# Patient Record
Sex: Female | Born: 1995 | Race: Black or African American | Hispanic: No | Marital: Single | Smoking: Never smoker
Health system: Southern US, Community
[De-identification: ages and names within clinical notes are randomized; demographics above are authoritative.]

## PROBLEM LIST (undated history)

## (undated) DIAGNOSIS — G43909 Migraine, unspecified, not intractable, without status migrainosus: Secondary | ICD-10-CM

## (undated) DIAGNOSIS — N390 Urinary tract infection, site not specified: Secondary | ICD-10-CM

## (undated) DIAGNOSIS — R040 Epistaxis: Secondary | ICD-10-CM

## (undated) HISTORY — PX: WISDOM TOOTH EXTRACTION: SHX21

---
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
## 2016-08-20 ENCOUNTER — Encounter: Payer: Self-pay | Admitting: Family Medicine

## 2016-08-20 ENCOUNTER — Ambulatory Visit (INDEPENDENT_AMBULATORY_CARE_PROVIDER_SITE_OTHER): Payer: Medicaid Other | Admitting: Family Medicine

## 2016-08-20 VITALS — BP 100/62 | HR 77 | Temp 98.2°F | Ht 67.0 in | Wt 164.0 lb

## 2016-08-20 DIAGNOSIS — Z0000 Encounter for general adult medical examination without abnormal findings: Secondary | ICD-10-CM

## 2016-08-20 NOTE — Patient Instructions (Signed)
It was great seeing you today! We have addressed the following issues today  1. I will see on 8/31 for HIV,GC/CH and any any other needed blood work needed from a health maintenance standpoint.  If we did any lab work today, and the results require attention, either me or my nurse will get in touch with you. If everything is normal, you will get a letter in mail and a message via . If you don't hear from us in two weeks, please give us a call. Otherwise, we look forward to seeing you again at your next visit. If you have any questions or concerns before then, please call the clinic at [phone].  Please bring all your medications to every doctors visit  Sign up for My Chart to have easy access to your labs results, and communication with your Primary care physician. Please ask Front Desk for some assistance.   Please check-out at the front desk before leaving the clinic.    Take Care,   Dr. Sydnee Cabaliallo

## 2016-08-20 NOTE — Progress Notes (Signed)
   Subjective:    Patient ID: Stann Orerykah J Briggs-White, female    DOB: Feb 12, 1995, 21 y.o.   MRN: [id number]   CC: Establishing care w/ new PCP  HPI: Patient is a 21 yo female who presents today to establish care with new PCP. Patient has no acute complaints today. She reports a sinus infection 2 weeks ago for which she received amoxicillin. Patient has been feeling better since then. 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 Patient denies any chest pain, SOB, abdominal pain, headaches or vision changes.  Smoking status reviewed   ROS: all other systems were reviewed and are negative other than in the HPI   Past Medical History:  Diagnosis Date  . Frequent nosebleeds 12/2013  . Migraines   . UTI (urinary tract infection)    started antibiotic 12/25/2013    Past Surgical History:  Procedure Laterality Date  . NASAL ENDOSCOPY WITH EPISTAXIS CONTROL N/A 01/01/2014   Procedure: ENDOSCOPIC NASAL CAUTERY;  Surgeon: Osborn Cohoavid Shoemaker, MD;  Location: [facility];  Service: ENT;  Laterality: N/A;  . WISDOM TOOTH EXTRACTION      Objective:  BP 100/62   Pulse 77   Temp 98.2 F (36.8 C) (Oral)   Ht 5\' 7"  (1.702 m)   Wt 164 lb (74.4 kg)   LMP 08/09/2016   SpO2 99%   BMI 25.69 kg/m   Vitals and nursing note reviewed  General: NAD, pleasant, able to participate in exam Cardiac: RRR, normal heart sounds, no murmurs. 2+ radial and PT pulses bilaterally Respiratory: CTAB, normal effort, No wheezes, rales or rhonchi Abdomen: soft, nontender, nondistended, no hepatic or splenomegaly, +BS Extremities: no edema or cyanosis. WWP. 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 Patient has not been sexually active in almost two years and does not have a partner at the moment. Patient is currently not using any birth control but was on OCP in the past. Patient is not 21 yo therefore not due for Pap smear. Will revisit at next office visit.  Lovena NeighboursAbdoulaye Domonick Sittner, MD [facility] PGY-2

## 2016-09-10 ENCOUNTER — Other Ambulatory Visit: Payer: Self-pay | Admitting: Family Medicine

## 2016-09-10 DIAGNOSIS — Z7689 Persons encountering health services in other specified circumstances: Secondary | ICD-10-CM | POA: Insufficient documentation

## 2016-09-11 ENCOUNTER — Other Ambulatory Visit: Payer: Medicaid Other
